# Patient Record
Sex: Female | Born: 1940 | Race: White | Hispanic: No | Marital: Married
Health system: Northeastern US, Academic
[De-identification: ages and names within clinical notes are randomized; demographics above are authoritative.]

---

## 2019-12-17 ENCOUNTER — Encounter: Admit: 2019-12-17 | Payer: PRIVATE HEALTH INSURANCE | Attending: Internal Medicine | Primary: Family Medicine

## 2019-12-17 ENCOUNTER — Inpatient Hospital Stay: Admit: 2019-12-17 | Discharge: 2019-12-17 | Payer: MEDICARE | Primary: Family Medicine

## 2019-12-17 DIAGNOSIS — R7303 Prediabetes: Secondary | ICD-10-CM

## 2019-12-17 DIAGNOSIS — I10 Essential (primary) hypertension: Secondary | ICD-10-CM

## 2019-12-17 DIAGNOSIS — N3000 Acute cystitis without hematuria: Secondary | ICD-10-CM

## 2019-12-17 DIAGNOSIS — Z01818 Encounter for other preprocedural examination: Secondary | ICD-10-CM

## 2019-12-17 DIAGNOSIS — Z1159 Encounter for screening for other viral diseases: Secondary | ICD-10-CM

## 2019-12-17 LAB — CBC WITH AUTO DIFFERENTIAL
BKR WAM ABSOLUTE IMMATURE GRANULOCYTES: 0 x 1000/ÂµL (ref 0.0–0.4)
BKR WAM ABSOLUTE LYMPHOCYTE COUNT: 0.9 x 1000/ÂµL (ref 0.5–5.4)
BKR WAM ABSOLUTE NRBC: 0 x 1000/ÂµL
BKR WAM ANALYZER ANC: 2.3 x 1000/ÂµL (ref 2.2–7.2)
BKR WAM BASOPHIL ABSOLUTE COUNT: 0 x 1000/ÂµL (ref 0.0–0.2)
BKR WAM BASOPHILS: 0.5 % (ref 0.0–2.0)
BKR WAM EOSINOPHIL ABSOLUTE COUNT: 0.1 x 1000/ÂµL (ref 0.0–0.4)
BKR WAM EOSINOPHILS: 2.7 % (ref 0.0–4.0)
BKR WAM HEMATOCRIT: 38.6 % (ref 36.0–48.0)
BKR WAM HEMOGLOBIN: 12.7 g/dL (ref 12.0–15.0)
BKR WAM IMMATURE GRANULOCYTES: 0.3 % (ref 0.0–0.4)
BKR WAM LYMPHOCYTES: 24.1 % (ref 10.0–50.0)
BKR WAM MCH (PG): 30.3 pg (ref 25.0–35.0)
BKR WAM MCHC: 32.9 g/dL — ABNORMAL LOW (ref 33.0–37.0)
BKR WAM MCV: 92.1 fL (ref 81.0–99.0)
BKR WAM MONOCYTE ABSOLUTE COUNT: 0.4 x 1000/ÂµL (ref 0.1–1.2)
BKR WAM MONOCYTES: 9.9 % (ref 3.0–11.0)
BKR WAM MPV: 11.4 fL (ref 8.0–12.0)
BKR WAM NEUTROPHILS: 62.5 % (ref 45.0–90.0)
BKR WAM NUCLEATED RED BLOOD CELLS: 0 % (ref 0.0–0.0)
BKR WAM PLATELETS: 166 x1000/ÂµL (ref 120–450)
BKR WAM RDW-CV: 13.1 % (ref 11.5–14.5)
BKR WAM RED BLOOD CELL COUNT: 4.2 M/ÂµL (ref 3.5–5.5)
BKR WAM WHITE BLOOD CELL COUNT: 3.7 x1000/ÂµL — ABNORMAL LOW (ref 4.8–10.8)

## 2019-12-17 LAB — COMPREHENSIVE METABOLIC PANEL
BKR A/G RATIO: 2 (ref 1.0–2.2)
BKR ALANINE AMINOTRANSFERASE (ALT): 14 U/L (ref 10–35)
BKR ALBUMIN: 4.3 g/dL (ref 3.6–4.9)
BKR ALKALINE PHOSPHATASE: 87 U/L (ref 9–122)
BKR ANION GAP: 9 (ref 7–17)
BKR ASPARTATE AMINOTRANSFERASE (AST): 20 U/L (ref 10–35)
BKR AST/ALT RATIO: 1.4
BKR BILIRUBIN TOTAL: 0.5 mg/dL (ref <=1.2)
BKR BLOOD UREA NITROGEN: 24 mg/dL — ABNORMAL HIGH (ref 8–23)
BKR BUN / CREAT RATIO: 40 — ABNORMAL HIGH (ref 8.0–23.0)
BKR CALCIUM: 9.2 mg/dL (ref 8.8–10.2)
BKR CHLORIDE: 103 mmol/L (ref 98–107)
BKR CO2: 29 mmol/L (ref 20–30)
BKR CREATININE: 0.6 mg/dL (ref 0.40–1.30)
BKR EGFR (AFR AMER): >60 mL/min/{1.73_m2} (ref >60)
BKR EGFR (NON AFRICAN AMERICAN): >60 mL/min/{1.73_m2} (ref >60)
BKR GLOBULIN: 2.1 g/dL
BKR GLUCOSE: 87 mg/dL (ref 70–100)
BKR POTASSIUM: 4.4 mmol/L (ref 3.3–5.1)
BKR PROTEIN TOTAL: 6.4 g/dL — ABNORMAL LOW (ref 6.6–8.7)
BKR SODIUM: 141 mmol/L (ref 136–144)

## 2019-12-17 LAB — HEMOGLOBIN A1C
BKR ESTIMATED AVERAGE GLUCOSE: 128 mg/dL
BKR HEMOGLOBIN A1C: 6.1 % — ABNORMAL HIGH (ref 4.0–5.6)

## 2019-12-17 LAB — PARTIAL THROMBOPLASTIN TIME     (BH GH LMW Q YH): BKR PARTIAL THROMBOPLASTIN TIME: 26.8 s (ref 23.0–32.1)

## 2019-12-17 LAB — PROTIME AND INR
BKR INR: 0.98 (ref 0.87–1.13)
BKR PROTHROMBIN TIME: 11 s (ref 9.5–12.1)

## 2019-12-17 MED ORDER — ALPRAZOLAM 0.25 MG TABLET
0.25 | ORAL_TABLET | Freq: Every evening | ORAL | 2 refills | 25.00000 days | Status: AC | PRN
Start: 2019-12-17 — End: ?

## 2019-12-17 MED ORDER — ALPRAZOLAM 0.25 MG TABLET
0.25 | Freq: Every evening | ORAL | 1.00 refills | 25.00000 days | Status: AC | PRN
Start: 2019-12-17 — End: 2019-12-17

## 2019-12-18 LAB — MSSA / MRSA SCREEN BY CULTURE   (BH GH LMW YH)
BKR MRSA MEDIA: NEGATIVE
BKR MSSA MEDIA (SAID): NEGATIVE

## 2019-12-18 LAB — HEPATITIS C AB WITH REFLEX TO HCV PCR
BKR HEPATITIS C ANTIBODY (BH): NONREACTIVE
BKR HEPATITIS C ANTIBODY INITIAL RESULT (BH): 0.04 COI

## 2019-12-19 NOTE — Other
A1c is in prediabetic range, MRSA screen is negative, otherwise Labs are normal w/o any significant acute abnormalities. Please let the patient know. Thank you

## 2019-12-19 NOTE — Other
Lmtc

## 2019-12-19 NOTE — Other
Pt.notified

## 2019-12-20 MED ORDER — CIPROFLOXACIN 250 MG TABLET
250 | ORAL_TABLET | Freq: Two times a day (BID) | ORAL | 1 refills | 7.00000 days | Status: AC
Start: 2019-12-20 — End: ?

## 2019-12-20 MED ORDER — NITROFURANTOIN MONOHYDRATE/MACROCRYSTALS 100 MG CAPSULE
100 | ORAL_CAPSULE | Freq: Two times a day (BID) | ORAL | 1 refills | 5.00000 days | Status: AC
Start: 2019-12-20 — End: 2019-12-20

## 2019-12-20 NOTE — Other
Her urine culture is growing some Gram-negative rods, not very convincing about a UTI but I am concerned that her surgery may be canceled due to this.  I would suggest that we consider treating with antibiotics, I will send Macrobid for 7 days to her pharmacy. Please let the patient know. Thank you

## 2019-12-20 NOTE — Other
Called pt, will change it to Cipro based on sens results. Thanks

## 2019-12-20 NOTE — Other
Pt.notified

## 2019-12-21 LAB — URINE CULTURE

## 2020-03-19 MED ORDER — HYDROXYZINE HCL 25 MG TABLET
25 | ORAL_TABLET | Freq: Three times a day (TID) | ORAL | 3 refills | 20.00000 days | Status: AC | PRN
Start: 2020-03-19 — End: 2021-04-22

## 2020-03-19 MED ORDER — TRAZODONE 50 MG TABLET
50 | ORAL_TABLET | Freq: Every evening | ORAL | 2 refills | 30.00000 days | Status: AC
Start: 2020-03-19 — End: 2021-04-22

## 2020-05-02 MED ORDER — ALBUTEROL SULFATE HFA 90 MCG/ACTUATION AEROSOL INHALER
90 | RESPIRATORY_TRACT | 2 refills | 25.00000 days | Status: AC | PRN
Start: 2020-05-02 — End: 2021-06-12

## 2020-05-09 MED ORDER — VALSARTAN 160 MG-HYDROCHLOROTHIAZIDE 25 MG TABLET
160-25 | ORAL | 3.00 refills | 90.00000 days | Status: AC
Start: 2020-05-09 — End: ?

## 2021-04-22 MED ORDER — GABAPENTIN 300 MG CAPSULE
300 | ORAL_CAPSULE | Freq: Four times a day (QID) | ORAL | 2 refills | 30.00000 days | Status: AC | PRN
Start: 2021-04-22 — End: 2022-05-14

## 2021-04-22 MED ORDER — HYDROXYZINE HCL 25 MG TABLET
25 | ORAL_TABLET | Freq: Three times a day (TID) | ORAL | 3 refills | 20.00000 days | Status: AC | PRN
Start: 2021-04-22 — End: 2021-12-11

## 2021-04-22 MED ORDER — GABAPENTIN 300 MG CAPSULE
300 | ORAL | 2.00 refills | 30.00000 days | Status: AC
Start: 2021-04-22 — End: 2021-04-22

## 2021-06-12 MED ORDER — ALBUTEROL SULFATE HFA 90 MCG/ACTUATION AEROSOL INHALER
90 | RESPIRATORY_TRACT | 2 refills | 25.00000 days | Status: AC
Start: 2021-06-12 — End: 2021-12-11

## 2021-12-08 ENCOUNTER — Encounter: Admit: 2021-12-08 | Payer: PRIVATE HEALTH INSURANCE | Attending: Internal Medicine | Primary: Family Medicine

## 2021-12-11 ENCOUNTER — Telehealth: Admit: 2021-12-11 | Payer: PRIVATE HEALTH INSURANCE | Attending: Family Medicine | Primary: Family Medicine

## 2021-12-11 ENCOUNTER — Ambulatory Visit: Admit: 2021-12-11 | Payer: MEDICARE | Attending: Family Medicine | Primary: Family Medicine

## 2021-12-11 ENCOUNTER — Encounter: Admit: 2021-12-11 | Payer: PRIVATE HEALTH INSURANCE | Attending: Family Medicine | Primary: Family Medicine

## 2021-12-11 MED ORDER — BREZTRI AEROSPHERE 160 MCG-9MCG-4.8MCG/ACTUATION HFA AEROSOL INHALER
160-9-4.8 | Freq: Two times a day (BID) | RESPIRATORY_TRACT | 1 refills | 30.00000 days | Status: AC
Start: 2021-12-11 — End: 2022-04-13

## 2021-12-11 MED ORDER — FLUTICASONE PROPIONATE 50 MCG/ACTUATION NASAL SPRAY,SUSPENSION
50 | Freq: Two times a day (BID) | NASAL | 3 refills | 30.00000 days | Status: AC
Start: 2021-12-11 — End: 2021-12-29

## 2021-12-11 MED ORDER — ALBUTEROL SULFATE HFA 90 MCG/ACTUATION AEROSOL INHALER
90 | Freq: Four times a day (QID) | RESPIRATORY_TRACT | 4 refills | 25.00000 days | Status: AC | PRN
Start: 2021-12-11 — End: 2022-12-15

## 2021-12-11 MED ORDER — AZITHROMYCIN 250 MG TABLET
250 | ORAL_TABLET | ORAL | 1 refills | 5.00000 days | Status: AC
Start: 2021-12-11 — End: 2021-12-11

## 2021-12-11 MED ORDER — MONTELUKAST 10 MG TABLET
10 | ORAL_TABLET | Freq: Every evening | ORAL | 4 refills | 90.00000 days | Status: AC
Start: 2021-12-11 — End: 2022-02-22

## 2021-12-14 ENCOUNTER — Encounter: Admit: 2021-12-14 | Payer: PRIVATE HEALTH INSURANCE | Attending: Family Medicine | Primary: Family Medicine

## 2021-12-14 MED ORDER — AZITHROMYCIN 250 MG TABLET
250 | ORAL_TABLET | ORAL | 1 refills | 5.00000 days | Status: AC
Start: 2021-12-14 — End: ?

## 2021-12-29 ENCOUNTER — Encounter: Admit: 2021-12-29 | Payer: PRIVATE HEALTH INSURANCE | Attending: Family Medicine | Primary: Family Medicine

## 2021-12-29 MED ORDER — FLUTICASONE PROPIONATE 50 MCG/ACTUATION NASAL SPRAY,SUSPENSION
50 | NASAL | 5 refills | 30.00000 days | Status: AC
Start: 2021-12-29 — End: 2022-04-13

## 2022-01-22 ENCOUNTER — Encounter
Admit: 2022-01-22 | Payer: PRIVATE HEALTH INSURANCE | Attending: Vascular and Interventional Radiology | Primary: Family Medicine

## 2022-02-20 ENCOUNTER — Encounter: Admit: 2022-02-20 | Payer: PRIVATE HEALTH INSURANCE | Attending: Family Medicine | Primary: Family Medicine

## 2022-02-22 MED ORDER — MONTELUKAST 10 MG TABLET
10 | ORAL_TABLET | Freq: Every evening | ORAL | 5 refills | 90.00000 days | Status: AC
Start: 2022-02-22 — End: 2022-05-14

## 2022-02-26 ENCOUNTER — Encounter: Admit: 2022-02-26 | Payer: PRIVATE HEALTH INSURANCE | Attending: Internal Medicine | Primary: Family Medicine

## 2022-03-17 ENCOUNTER — Encounter: Admit: 2022-03-17 | Payer: PRIVATE HEALTH INSURANCE | Primary: Family Medicine

## 2022-03-22 ENCOUNTER — Encounter: Admit: 2022-03-22 | Payer: MEDICARE | Attending: Gastroenterology | Primary: Family Medicine

## 2022-03-22 ENCOUNTER — Encounter: Admit: 2022-03-22 | Payer: PRIVATE HEALTH INSURANCE | Attending: Gastroenterology | Primary: Family Medicine

## 2022-03-22 MED ORDER — PEG 3350 240 GRAM-ELECTROLYTES 22.72 GRAM-6.72 G-5.84 G POWDR FOR SOLN
240-22.72-6.72 | Freq: Once | ORAL | 1 refills | 30.00000 days | Status: AC
Start: 2022-03-22 — End: ?

## 2022-03-28 ENCOUNTER — Encounter: Admit: 2022-03-28 | Payer: PRIVATE HEALTH INSURANCE | Attending: Gastroenterology | Primary: Family Medicine

## 2022-03-29 ENCOUNTER — Encounter: Admit: 2022-03-29 | Payer: PRIVATE HEALTH INSURANCE | Attending: Family Medicine | Primary: Family Medicine

## 2022-03-30 ENCOUNTER — Encounter: Admit: 2022-03-30 | Payer: PRIVATE HEALTH INSURANCE | Attending: Family Medicine | Primary: Family Medicine

## 2022-04-04 ENCOUNTER — Encounter: Admit: 2022-04-04 | Payer: PRIVATE HEALTH INSURANCE | Attending: Family Medicine | Primary: Family Medicine

## 2022-04-07 ENCOUNTER — Encounter: Admit: 2022-04-07 | Payer: PRIVATE HEALTH INSURANCE | Attending: Family Medicine | Primary: Family Medicine

## 2022-04-13 ENCOUNTER — Encounter: Admit: 2022-04-13 | Payer: PRIVATE HEALTH INSURANCE | Attending: Family Medicine | Primary: Family Medicine

## 2022-04-13 ENCOUNTER — Encounter: Admit: 2022-04-13 | Payer: PRIVATE HEALTH INSURANCE | Primary: Family Medicine

## 2022-04-13 ENCOUNTER — Ambulatory Visit: Admit: 2022-04-13 | Payer: MEDICARE | Attending: Family Medicine | Primary: Family Medicine

## 2022-04-13 ENCOUNTER — Telehealth: Admit: 2022-04-13 | Payer: PRIVATE HEALTH INSURANCE | Attending: Family Medicine | Primary: Family Medicine

## 2022-04-13 ENCOUNTER — Inpatient Hospital Stay: Admit: 2022-04-13 | Discharge: 2022-04-13 | Payer: MEDICARE | Primary: Family Medicine

## 2022-04-13 DIAGNOSIS — R8290 Unspecified abnormal findings in urine: Secondary | ICD-10-CM

## 2022-04-13 DIAGNOSIS — R002 Palpitations: Secondary | ICD-10-CM

## 2022-04-13 LAB — TSH W/REFLEX TO FT4     (BH GH LMW Q YH): BKR THYROID STIMULATING HORMONE: 1.65 ??IU/mL

## 2022-04-13 MED ORDER — ESCITALOPRAM 5 MG TABLET
5 | ORAL_TABLET | Freq: Every morning | ORAL | 3 refills | 90.00000 days | Status: AC
Start: 2022-04-13 — End: 2022-05-14

## 2022-04-14 ENCOUNTER — Encounter: Admit: 2022-04-14 | Payer: PRIVATE HEALTH INSURANCE | Attending: Family | Primary: Family Medicine

## 2022-04-14 LAB — URINE CULTURE: BKR URINE CULTURE, ROUTINE: NO GROWTH

## 2022-04-14 MED ORDER — TRAZODONE 50 MG TABLET
50 | ORAL_TABLET | Freq: Every evening | ORAL | 2 refills | 30.00000 days | Status: AC
Start: 2022-04-14 — End: 2022-05-20

## 2022-04-20 ENCOUNTER — Encounter: Admit: 2022-04-20 | Payer: PRIVATE HEALTH INSURANCE | Attending: Family Medicine | Primary: Family Medicine

## 2022-04-23 ENCOUNTER — Encounter: Admit: 2022-04-23 | Payer: MEDICARE | Attending: Internal Medicine | Primary: Family Medicine

## 2022-04-27 ENCOUNTER — Telehealth: Admit: 2022-04-27 | Payer: PRIVATE HEALTH INSURANCE | Attending: Obstetrics and Gynecology | Primary: Family Medicine

## 2022-05-04 ENCOUNTER — Encounter: Admit: 2022-05-04 | Payer: MEDICARE | Attending: Obstetrics and Gynecology | Primary: Family Medicine

## 2022-05-05 ENCOUNTER — Encounter
Admit: 2022-05-05 | Payer: PRIVATE HEALTH INSURANCE | Attending: Vascular and Interventional Radiology | Primary: Family Medicine

## 2022-05-10 ENCOUNTER — Encounter: Admit: 2022-05-10 | Payer: PRIVATE HEALTH INSURANCE | Attending: Gastroenterology | Primary: Family Medicine

## 2022-05-10 ENCOUNTER — Telehealth: Admit: 2022-05-10 | Payer: PRIVATE HEALTH INSURANCE | Attending: Obstetrics and Gynecology | Primary: Family Medicine

## 2022-05-10 ENCOUNTER — Encounter: Admit: 2022-05-10 | Payer: PRIVATE HEALTH INSURANCE | Attending: Obstetrics and Gynecology | Primary: Family Medicine

## 2022-05-10 ENCOUNTER — Ambulatory Visit: Admit: 2022-05-10 | Payer: MEDICARE | Attending: Obstetrics and Gynecology | Primary: Family Medicine

## 2022-05-10 MED ORDER — MAGNESIUM OXIDE 400 MG (241.3 MG MAGNESIUM) TABLET
400 | Freq: Every day | ORAL | 1.00 refills | 90.00000 days | Status: AC
Start: 2022-05-10 — End: ?

## 2022-05-11 ENCOUNTER — Encounter: Admit: 2022-05-11 | Payer: PRIVATE HEALTH INSURANCE | Primary: Family Medicine

## 2022-05-14 ENCOUNTER — Ambulatory Visit: Admit: 2022-05-14 | Payer: MEDICARE | Attending: Family Medicine | Primary: Family Medicine

## 2022-05-14 ENCOUNTER — Encounter: Admit: 2022-05-14 | Payer: PRIVATE HEALTH INSURANCE | Attending: Family Medicine | Primary: Family Medicine

## 2022-05-14 ENCOUNTER — Telehealth: Admit: 2022-05-14 | Payer: PRIVATE HEALTH INSURANCE | Attending: Family Medicine | Primary: Family Medicine

## 2022-05-14 ENCOUNTER — Encounter: Admit: 2022-05-14 | Payer: PRIVATE HEALTH INSURANCE | Primary: Family Medicine

## 2022-05-14 ENCOUNTER — Encounter
Admit: 2022-05-14 | Payer: PRIVATE HEALTH INSURANCE | Attending: Vascular and Interventional Radiology | Primary: Family Medicine

## 2022-05-14 MED ORDER — ONDANSETRON 4 MG DISINTEGRATING TABLET
4 | ORAL_TABLET | Freq: Three times a day (TID) | 1 refills | 7.00000 days | Status: AC | PRN
Start: 2022-05-14 — End: 2022-05-14

## 2022-05-14 MED ORDER — ALPRAZOLAM 0.25 MG TABLET
0.25 | ORAL_TABLET | Freq: Three times a day (TID) | ORAL | 1 refills | 25.00000 days | Status: AC | PRN
Start: 2022-05-14 — End: 2022-05-14

## 2022-05-14 MED ORDER — ONDANSETRON 4 MG DISINTEGRATING TABLET
4 | ORAL_TABLET | Freq: Three times a day (TID) | 1 refills | 7.00000 days | Status: AC | PRN
Start: 2022-05-14 — End: 2022-05-20

## 2022-05-14 MED ORDER — MULTIVITAMIN WITH MINERALS CAPSULE
Freq: Every day | ORAL | 0.00 refills | 90.00000 days | Status: AC
Start: 2022-05-14 — End: 2023-04-04

## 2022-05-14 MED ORDER — APIXABAN 5 MG TABLET
5 | Freq: Two times a day (BID) | ORAL | 4.00 refills | 30.00000 days | Status: AC
Start: 2022-05-14 — End: ?

## 2022-05-14 MED ORDER — ALPRAZOLAM 0.25 MG TABLET
0.25 | ORAL_TABLET | Freq: Three times a day (TID) | ORAL | 1 refills | 25.00000 days | Status: AC | PRN
Start: 2022-05-14 — End: ?

## 2022-05-14 MED ORDER — METOPROLOL SUCCINATE ER 50 MG TABLET,EXTENDED RELEASE 24 HR
50 | Freq: Every day | ORAL | 4.00 refills | 90.00000 days | Status: AC
Start: 2022-05-14 — End: 2023-03-31

## 2022-05-14 MED ORDER — ROSUVASTATIN 40 MG TABLET
40 | ORAL | 4.00 refills | 90.00000 days | Status: AC
Start: 2022-05-14 — End: ?

## 2022-05-14 MED ORDER — TRIPLE MAGNESIUM COMPLEX 400 MG MAGNESIUM CAPSULE
400 | Freq: Every day | ORAL | 0.00 refills | 90.00000 days | Status: AC
Start: 2022-05-14 — End: 2023-04-04

## 2022-05-20 ENCOUNTER — Encounter: Admit: 2022-05-20 | Payer: PRIVATE HEALTH INSURANCE | Attending: Family Medicine | Primary: Family Medicine

## 2022-05-20 ENCOUNTER — Encounter: Admit: 2022-05-20 | Payer: MEDICARE | Attending: Family Medicine | Primary: Family Medicine

## 2022-05-20 MED ORDER — TRAZODONE 50 MG TABLET
50 | ORAL_TABLET | Freq: Every evening | ORAL | 4 refills | 30.00000 days | Status: AC
Start: 2022-05-20 — End: 2022-08-03

## 2022-05-20 MED ORDER — ONDANSETRON 4 MG DISINTEGRATING TABLET
4 | ORAL_TABLET | Freq: Three times a day (TID) | 1 refills | 7.00000 days | Status: AC | PRN
Start: 2022-05-20 — End: 2022-05-20

## 2022-05-20 MED ORDER — ONDANSETRON 4 MG DISINTEGRATING TABLET
4 | ORAL_TABLET | Freq: Three times a day (TID) | 1 refills | 7.00000 days | Status: AC | PRN
Start: 2022-05-20 — End: ?

## 2022-05-20 MED ORDER — DILTIAZEM CD 120 MG CAPSULE,EXTENDED RELEASE 24 HR
120 | Freq: Every day | ORAL | 4.00 refills | 90.00000 days | Status: AC
Start: 2022-05-20 — End: 2023-04-04

## 2022-07-06 ENCOUNTER — Encounter: Admit: 2022-07-06 | Payer: PRIVATE HEALTH INSURANCE | Attending: Obstetrics and Gynecology | Primary: Family Medicine

## 2022-08-03 ENCOUNTER — Encounter: Admit: 2022-08-03 | Payer: PRIVATE HEALTH INSURANCE | Attending: Family | Primary: Family Medicine

## 2022-08-03 MED ORDER — TRAZODONE 50 MG TABLET
50 | ORAL_TABLET | Freq: Every evening | ORAL | 4 refills | 30.00000 days | Status: AC
Start: 2022-08-03 — End: 2023-03-31

## 2022-11-11 ENCOUNTER — Telehealth: Admit: 2022-11-11 | Payer: PRIVATE HEALTH INSURANCE | Attending: Family Medicine | Primary: Family Medicine

## 2022-12-15 ENCOUNTER — Encounter: Admit: 2022-12-15 | Payer: PRIVATE HEALTH INSURANCE | Attending: Family Medicine | Primary: Family Medicine

## 2022-12-15 MED ORDER — ALBUTEROL SULFATE HFA 90 MCG/ACTUATION AEROSOL INHALER
90 | RESPIRATORY_TRACT | 4 refills | 25.00000 days | Status: AC
Start: 2022-12-15 — End: ?

## 2023-01-21 ENCOUNTER — Encounter: Admit: 2023-01-21 | Payer: PRIVATE HEALTH INSURANCE | Attending: Family Medicine | Primary: Family Medicine

## 2023-01-24 ENCOUNTER — Encounter: Admit: 2023-01-24 | Payer: PRIVATE HEALTH INSURANCE | Attending: Family Medicine | Primary: Family Medicine

## 2023-02-03 ENCOUNTER — Encounter: Admit: 2023-02-03 | Payer: MEDICARE | Primary: Family Medicine

## 2023-02-10 ENCOUNTER — Encounter: Admit: 2023-02-10 | Payer: PRIVATE HEALTH INSURANCE | Primary: Family Medicine

## 2023-02-10 ENCOUNTER — Encounter: Admit: 2023-02-10 | Payer: MEDICARE | Primary: Family Medicine

## 2023-02-14 ENCOUNTER — Encounter: Admit: 2023-02-14 | Payer: MEDICARE | Primary: Family Medicine

## 2023-02-18 ENCOUNTER — Telehealth: Admit: 2023-02-18 | Payer: PRIVATE HEALTH INSURANCE | Attending: Family Medicine | Primary: Family Medicine

## 2023-02-21 ENCOUNTER — Encounter: Admit: 2023-02-21 | Payer: PRIVATE HEALTH INSURANCE | Primary: Family Medicine

## 2023-02-21 ENCOUNTER — Encounter: Admit: 2023-02-21 | Payer: MEDICARE | Primary: Family Medicine

## 2023-02-23 ENCOUNTER — Encounter: Admit: 2023-02-23 | Payer: PRIVATE HEALTH INSURANCE | Attending: Family Medicine | Primary: Family Medicine

## 2023-02-28 ENCOUNTER — Encounter: Admit: 2023-02-28 | Payer: MEDICARE | Primary: Family Medicine

## 2023-03-11 ENCOUNTER — Encounter: Admit: 2023-03-11 | Payer: PRIVATE HEALTH INSURANCE | Attending: Family Medicine | Primary: Family Medicine

## 2023-03-11 ENCOUNTER — Encounter: Admit: 2023-03-11 | Payer: PRIVATE HEALTH INSURANCE | Primary: Family Medicine

## 2023-03-11 ENCOUNTER — Encounter: Admit: 2023-03-11 | Payer: MEDICARE | Primary: Family Medicine

## 2023-03-15 ENCOUNTER — Encounter: Admit: 2023-03-15 | Payer: PRIVATE HEALTH INSURANCE | Attending: Family Medicine | Primary: Family Medicine

## 2023-03-31 ENCOUNTER — Encounter: Admit: 2023-03-31 | Payer: PRIVATE HEALTH INSURANCE | Attending: Family Medicine | Primary: Family Medicine

## 2023-03-31 ENCOUNTER — Inpatient Hospital Stay: Admit: 2023-03-31 | Discharge: 2023-03-31 | Payer: MEDICARE | Primary: Family Medicine

## 2023-03-31 ENCOUNTER — Ambulatory Visit: Admit: 2023-03-31 | Payer: MEDICARE | Attending: Family Medicine | Primary: Family Medicine

## 2023-03-31 DIAGNOSIS — G5601 Carpal tunnel syndrome, right upper limb: Secondary | ICD-10-CM

## 2023-03-31 DIAGNOSIS — Z01818 Encounter for other preprocedural examination: Secondary | ICD-10-CM

## 2023-03-31 LAB — BASIC METABOLIC PANEL
BKR ANION GAP: 9 g/dL (ref 7–17)
BKR BLOOD UREA NITROGEN: 21 mg/dL (ref 8–23)
BKR BUN / CREAT RATIO: 29.6 % — ABNORMAL HIGH (ref 8.0–23.0)
BKR CALCIUM: 9.6 mg/dL — ABNORMAL HIGH (ref 8.8–10.2)
BKR CHLORIDE: 98 mmol/L (ref 98–107)
BKR CO2: 30 mmol/L (ref 20–30)
BKR CREATININE: 0.71 mg/dL (ref 0.40–1.30)
BKR EGFR, CREATININE (CKD-EPI 2021): >60 mL/min/{1.73_m2} (ref >=60–12.0)
BKR GLUCOSE: 103 mg/dL — ABNORMAL HIGH (ref 70–100)
BKR POTASSIUM: 4.5 mmol/L (ref 3.3–5.3)
BKR SODIUM: 137 mmol/L (ref 136–144)

## 2023-03-31 LAB — CBC WITH AUTO DIFFERENTIAL
BKR WAM ABSOLUTE IMMATURE GRANULOCYTES.: 0.01 x 1000/ÂµL (ref 0.00–0.30)
BKR WAM ABSOLUTE LYMPHOCYTE COUNT.: 0.98 x 1000/ÂµL (ref 0.60–3.70)
BKR WAM ABSOLUTE NRBC (2 DEC): 0 x 1000/ÂµL (ref 0.00–1.00)
BKR WAM ANC (ABSOLUTE NEUTROPHIL COUNT): 4.41 x 1000/ÂµL (ref 2.00–7.60)
BKR WAM BASOPHIL ABSOLUTE COUNT.: 0.02 x 1000/ÂµL (ref 0.00–1.00)
BKR WAM BASOPHILS: 0.3 % (ref 0.0–1.4)
BKR WAM EOSINOPHIL ABSOLUTE COUNT.: 0.06 x 1000/ÂµL (ref 0.00–1.00)
BKR WAM EOSINOPHILS: 1 % (ref 0.0–5.0)
BKR WAM HEMATOCRIT (2 DEC): 36.9 % (ref 35.00–45.00)
BKR WAM HEMOGLOBIN: 11.8 g/dL (ref 11.7–15.5)
BKR WAM IMMATURE GRANULOCYTES: 0.2 % (ref 0.0–1.0)
BKR WAM LYMPHOCYTES: 16.3 % — ABNORMAL LOW (ref 17.0–50.0)
BKR WAM MCH (PG): 29.1 pg (ref 27.0–33.0)
BKR WAM MCHC: 32 g/dL (ref 31.0–36.0)
BKR WAM MCV: 90.9 fL (ref 80.0–100.0)
BKR WAM MONOCYTE ABSOLUTE COUNT.: 0.53 x 1000/ÂµL (ref 0.00–1.00)
BKR WAM MONOCYTES: 8.8 % (ref 4.0–12.0)
BKR WAM MPV: 11 fL (ref 8.0–12.0)
BKR WAM NEUTROPHILS: 73.4 % — ABNORMAL HIGH (ref 39.0–72.0)
BKR WAM NUCLEATED RED BLOOD CELLS: 0 % (ref 0.0–1.0)
BKR WAM PLATELETS: 213 x1000/ÂµL (ref 150–420)
BKR WAM RDW-CV: 13.9 % (ref 11.0–15.0)
BKR WAM RED BLOOD CELL COUNT.: 4.06 M/ÂµL (ref 4.00–6.00)
BKR WAM WHITE BLOOD CELL COUNT: 6 x1000/ÂµL (ref 4.0–11.0)

## 2023-03-31 MED ORDER — ESCITALOPRAM 10 MG TABLET
10 | Freq: Every day | ORAL | 2.00 refills | 90.00000 days | Status: AC
Start: 2023-03-31 — End: 2023-05-17

## 2023-04-04 ENCOUNTER — Encounter: Admit: 2023-04-04 | Payer: PRIVATE HEALTH INSURANCE | Attending: Family Medicine | Primary: Family Medicine

## 2023-04-04 NOTE — Other
Faxed

## 2023-04-19 ENCOUNTER — Encounter: Admit: 2023-04-19 | Payer: MEDICARE | Attending: Family Medicine | Primary: Family Medicine

## 2023-04-21 ENCOUNTER — Encounter: Admit: 2023-04-21 | Payer: PRIVATE HEALTH INSURANCE | Attending: Pulmonary Disease | Primary: Family Medicine

## 2023-04-21 ENCOUNTER — Encounter: Admit: 2023-04-21 | Payer: MEDICARE | Attending: Pulmonary Disease | Primary: Family Medicine

## 2023-04-21 MED ORDER — STIOLTO RESPIMAT 2.5 MCG-2.5 MCG/ACTUATION SOLUTION FOR INHALATION
2.5-2.5 | RESPIRATORY_TRACT | 4.00 refills | 90.00000 days | Status: AC
Start: 2023-04-21 — End: ?

## 2023-04-21 MED ORDER — ACETAMINOPHEN 500 MG TABLET
500 | Freq: Three times a day (TID) | ORAL | 0.00 refills | 5.00000 days | Status: AC | PRN
Start: 2023-04-21 — End: 2023-05-09

## 2023-05-02 ENCOUNTER — Encounter: Admit: 2023-05-02 | Payer: MEDICARE | Attending: Pulmonary Disease | Primary: Family Medicine

## 2023-05-04 ENCOUNTER — Encounter: Admit: 2023-05-04 | Payer: PRIVATE HEALTH INSURANCE | Attending: Pulmonary Disease | Primary: Family Medicine

## 2023-05-05 ENCOUNTER — Encounter: Admit: 2023-05-05 | Payer: PRIVATE HEALTH INSURANCE | Attending: Pulmonary Disease | Primary: Family Medicine

## 2023-05-09 ENCOUNTER — Encounter: Admit: 2023-05-09 | Payer: MEDICARE | Attending: Pulmonary Disease | Primary: Family Medicine

## 2023-05-09 ENCOUNTER — Encounter: Admit: 2023-05-09 | Payer: PRIVATE HEALTH INSURANCE | Attending: Pulmonary Disease | Primary: Family Medicine

## 2023-05-16 ENCOUNTER — Telehealth: Admit: 2023-05-16 | Payer: PRIVATE HEALTH INSURANCE | Attending: Pulmonary Disease | Primary: Family Medicine

## 2023-05-17 ENCOUNTER — Ambulatory Visit: Admit: 2023-05-17 | Payer: MEDICARE | Attending: Family Medicine | Primary: Family Medicine

## 2023-05-17 ENCOUNTER — Encounter: Admit: 2023-05-17 | Payer: PRIVATE HEALTH INSURANCE | Attending: Family Medicine | Primary: Family Medicine

## 2023-05-17 ENCOUNTER — Inpatient Hospital Stay: Admit: 2023-05-17 | Discharge: 2023-05-17 | Payer: MEDICARE | Primary: Family Medicine

## 2023-05-17 DIAGNOSIS — R7303 Prediabetes: Secondary | ICD-10-CM

## 2023-05-17 DIAGNOSIS — I4891 Unspecified atrial fibrillation: Secondary | ICD-10-CM

## 2023-05-17 LAB — HEMOGLOBIN A1C
BKR ESTIMATED AVERAGE GLUCOSE: 128 mg/dL
BKR HEMOGLOBIN A1C: 6.1 % — ABNORMAL HIGH (ref 4.0–5.6)

## 2023-05-17 MED ORDER — ESCITALOPRAM 5 MG TABLET
5 | ORAL_TABLET | Freq: Every day | ORAL | 2 refills | 90.00000 days | Status: AC
Start: 2023-05-17 — End: 2023-07-05

## 2023-05-18 LAB — LIPID PANEL
BKR CHOLESTEROL/HDL RATIO: 2.3 (ref 0.0–5.0)
BKR CHOLESTEROL: 160 mg/dL
BKR HDL CHOLESTEROL: 69 mg/dL (ref >=40)
BKR LDL CHOLESTEROL SAMPSON CALCULATED: 75 mg/dL
BKR TRIGLYCERIDES: 85 mg/dL

## 2023-05-18 LAB — TSH W/REFLEX TO FT4     (BH GH LMW Q YH): BKR THYROID STIMULATING HORMONE: 2.12 u[IU]/mL

## 2023-05-19 ENCOUNTER — Encounter: Admit: 2023-05-19 | Payer: PRIVATE HEALTH INSURANCE | Attending: Family Medicine | Primary: Family Medicine

## 2023-06-01 ENCOUNTER — Telehealth: Admit: 2023-06-01 | Payer: PRIVATE HEALTH INSURANCE | Primary: Family Medicine

## 2023-06-07 ENCOUNTER — Encounter: Admit: 2023-06-07 | Payer: PRIVATE HEALTH INSURANCE | Attending: Family Medicine | Primary: Family Medicine

## 2023-06-21 IMAGING — MR MRI LUMBAR SPINE WITHOUT CONTRAST
5 of 8 series · 8 of 48 positions shown · IV contrast (gadolinium)
Comparison: MRI lumbar spine October 26, 2011.

________________________________________________________________________________________________ 
MRI LUMBAR SPINE WITHOUT CONTRAST, 06/21/2023 [DATE]: 
CLINICAL INDICATION: Radiculopathy, Lumbar Region , low back pain with radiation 
down the left leg. No trauma.
TECHNIQUE: Multiplanar, multiecho position MR images of the lumbar spine were 
performed without intravenous gadolinium enhancement. Patient was scanned on a 
3T magnet

[Series 101: survey · axial · 10.0mm · 1.39mm/px · 1 of 9 slices shown]
[im 1/9]
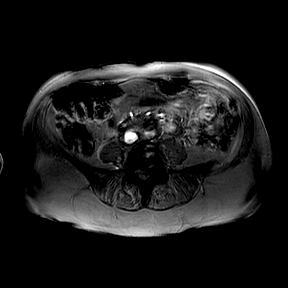

[Series 201: t2w_cor-surv · coronal · 6.0mm · 0.50mm/px · 1 of 7 slices shown]
[im 1/7]
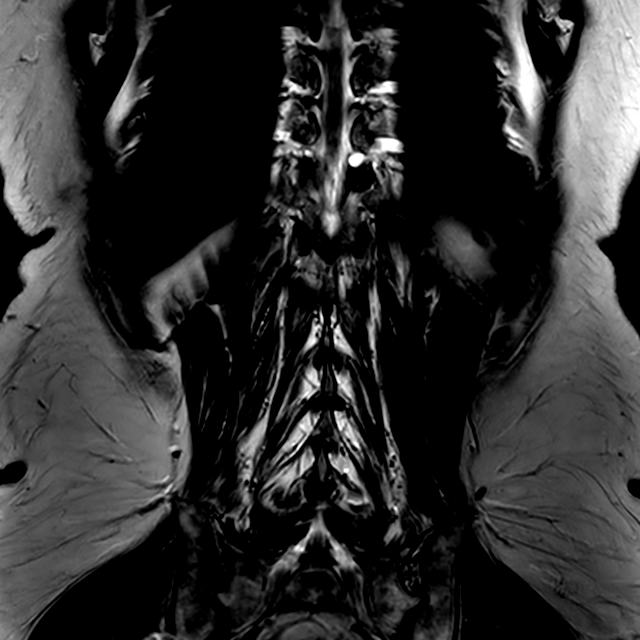

[Series 301: t1w_tse sag · sagittal · 4.0mm · 0.21mm/px · 2 of 17 slices shown]
[im 1/17]
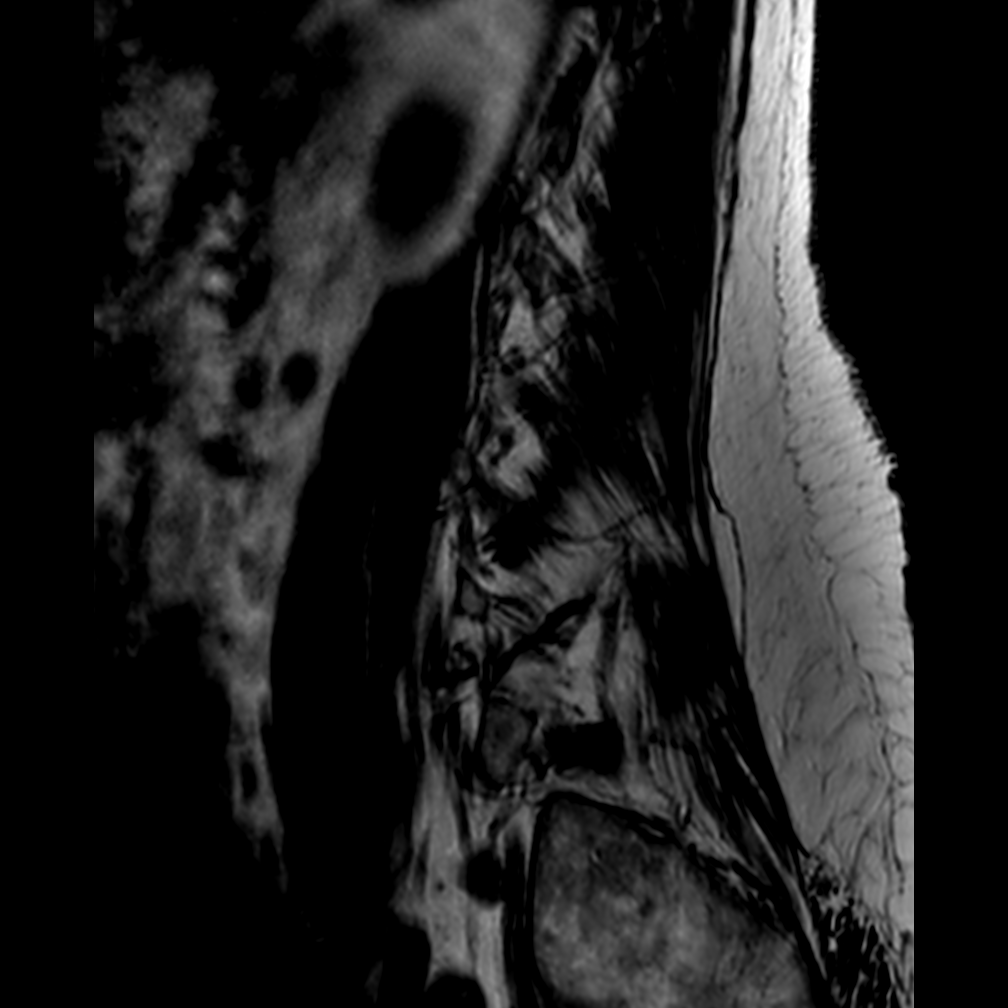
[im 17/17]
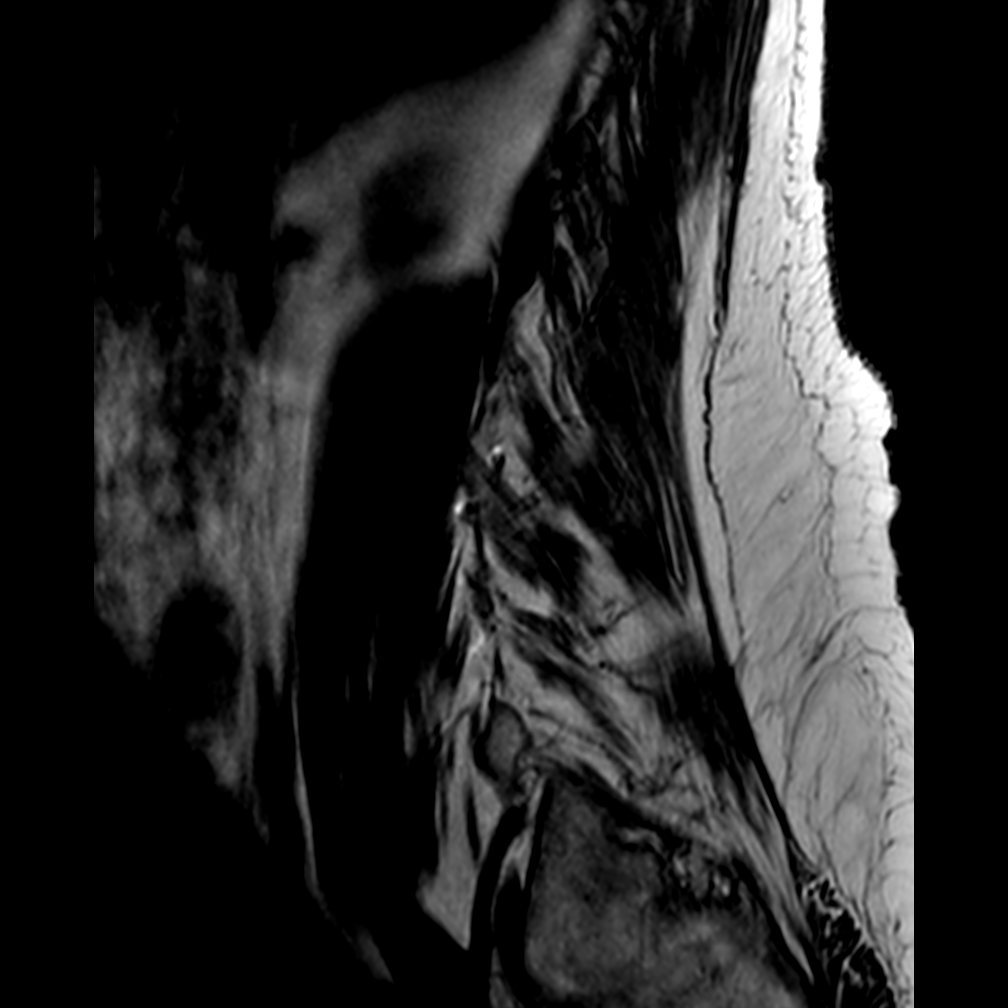

[Series 401: t2w_tse sag · sagittal · 4.0mm · 0.24mm/px · 3 of 17 slices shown]
[im 1/17]
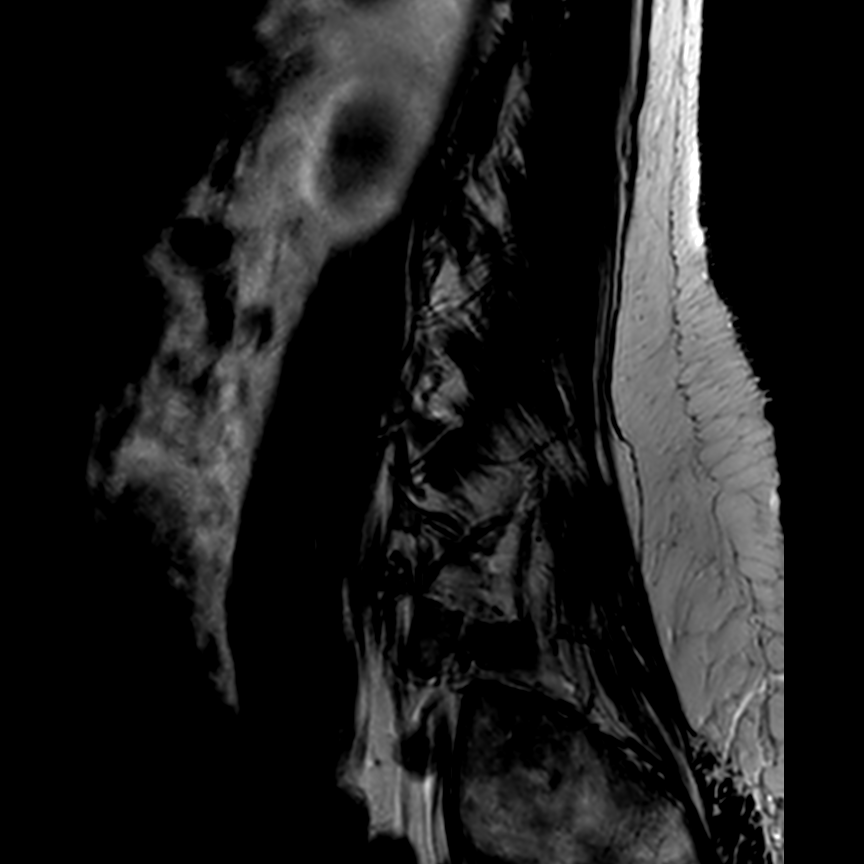
[im 9/17]
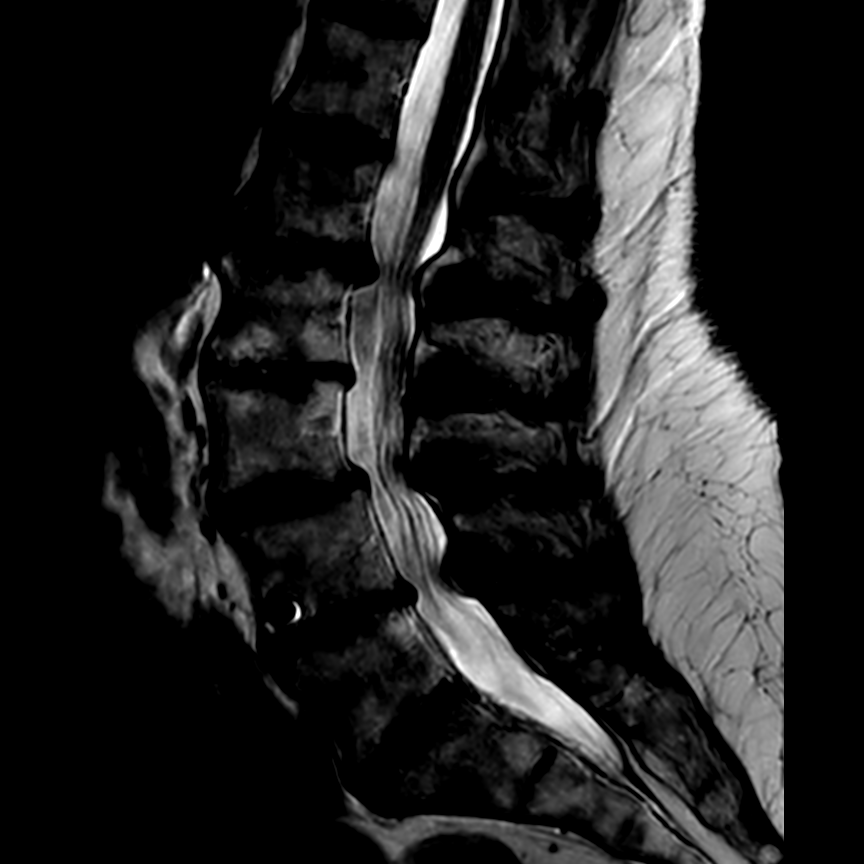
[im 17/17]
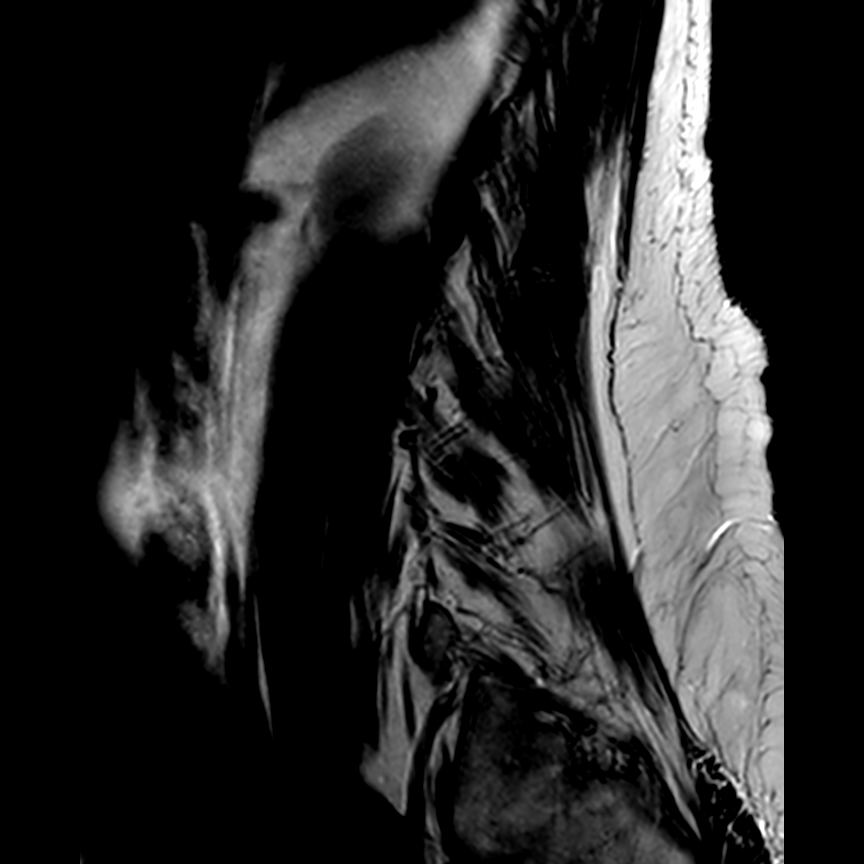

[Series 501: stir_sag-philips · sagittal · 4.0mm · 0.48mm/px · 1 of 17 slices shown]
[im 1/17]
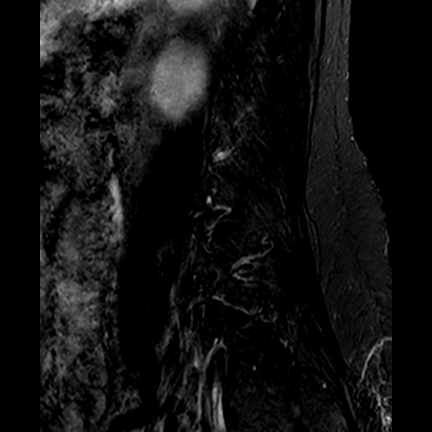

[8 of 48 positions shown; findings below may reference images not displayed]

FINDINGS: -------------------------------------------------------------------------------- 
------ 
GENERAL: 
Nomenclature is based on 5 lumbar type vertebral bodies.     
ALIGNMENT: Normal coronal alignment. Grade 1 retrolisthesis L1 on L2 with grade 
1 anterolisthesis L3 on L4. 
VERTEBRAL BODY HEIGHT: Normal.  
MARROW SIGNAL: No evidence of a marrow infiltrative process. There is a 
uniformly hypointense signal in the anterior aspect of the L4 vertebral body 
that could be related to metal artifact. 
CORD SIGNAL: Normal distal spinal cord and cauda equina. Conus medullaris 
terminates at L1-L2. 
ADDITIONAL FINDINGS: Susceptibility artifact from bilateral hip arthroplasties.. 
Modic I-II: L1-L2, L2-L3, L3-L4, L4-L5 
Ligamentum Flavum > 2.5 mm: All levels. 
-------------------------------------------------------------------------------- 
------ 
SEGMENTAL: 
T12-L1: Loss of disc signal. Annular bulge with a right paracentral disc 
extrusion measuring 4 mm in AP dimension. Borderline canal stenosis. Foramina 
patent. Normal facets. The extrusion is new in the interval. 
L1-L2: Loss of disc height and signal with degenerative endplate irregularity. 
Posterior osteophytic ridging and annular bulge with mild dorsal epidural 
lipomatosis. Mild canal stenosis with mild lateral recess narrowing bilaterally. 
Foramina patent. Facet arthropathy. Findings progressed. 
L2-L3: Loss of disc height and signal with degenerative endplate irregularity. 
Minimal central disc protrusion. Canal and foramina are patent. Mild facet 
arthropathy. Loss of disc height has progressed. Canal and foramina are stable. 
L3-L4: Loss of disc height and signal specifically posteriorly. Schmorls node. 
Mild to moderate canal stenosis with mild lateral recess narrowing bilaterally. 
Facet arthropathy and ligamentum flavum hypertrophy. Mild bilateral foraminal 
narrowing. Stable. 
L4-L5: Loss of disc height has progressed. Significant loss of disc height and 
signal with endplate irregularity and Schmorls nodes. Mild diffuse annular 
bulge with posterior osteophytic ridging with mild to moderate canal stenosis. 
Facet arthropathy, small right facet joint effusion and ligamentum flavum 
hypertrophy. Foramina are mild to moderately narrowed on the left and mildly 
narrowed on the right. Canal stenosis has progressed at this level. 
L5-S1: Loss of disc height and signal with degenerative endplate irregularity. 
Canal patent. Mild bilateral foraminal narrowing with facet arthropathy. Stable. 
-------------------------------------------------------------------------------- 
------
IMPRESSION: Progressive lumbar spondylosis at L1-L2 where there is mild canal stenosis and 
mild lateral recess narrowing bilaterally. 
Loss of disc height has progressed at L4-L5. Canal stenosis has progressed at 
this level. There is mild to moderate canal stenosis with mild to moderate left 
foraminal narrowing. 
Progressive loss of disc height also at L2-L3. 
Other stable appearing multilevel lumbar degenerative changes.

## 2023-07-04 ENCOUNTER — Encounter: Admit: 2023-07-04 | Payer: PRIVATE HEALTH INSURANCE | Attending: Family Medicine | Primary: Family Medicine

## 2023-07-05 ENCOUNTER — Encounter
Admit: 2023-07-05 | Payer: PRIVATE HEALTH INSURANCE | Attending: Vascular and Interventional Radiology | Primary: Family Medicine

## 2023-07-05 MED ORDER — ESCITALOPRAM 5 MG TABLET
5 | ORAL_TABLET | Freq: Every day | ORAL | 5 refills | 90.00000 days | Status: AC
Start: 2023-07-05 — End: ?

## 2023-07-11 ENCOUNTER — Encounter: Admit: 2023-07-11 | Payer: PRIVATE HEALTH INSURANCE | Primary: Family Medicine

## 2023-07-12 ENCOUNTER — Encounter: Admit: 2023-07-12 | Payer: PRIVATE HEALTH INSURANCE | Attending: Family Medicine | Primary: Family Medicine

## 2023-07-12 MED ORDER — RAMELTEON 8 MG TABLET
8 | ORAL_TABLET | Freq: Every evening | ORAL | 1 refills | 30.00000 days | Status: AC
Start: 2023-07-12 — End: ?

## 2023-09-27 ENCOUNTER — Encounter: Admit: 2023-09-27 | Payer: PRIVATE HEALTH INSURANCE | Primary: Family Medicine

## 2023-10-25 ENCOUNTER — Encounter: Admit: 2023-10-25 | Payer: PRIVATE HEALTH INSURANCE | Attending: Family Medicine | Primary: Family Medicine

## 2023-12-12 ENCOUNTER — Encounter
Admit: 2023-12-12 | Payer: PRIVATE HEALTH INSURANCE | Attending: Vascular and Interventional Radiology | Primary: Family Medicine

## 2023-12-13 ENCOUNTER — Encounter: Admit: 2023-12-13 | Payer: PRIVATE HEALTH INSURANCE | Attending: Pulmonary Disease | Primary: Family Medicine

## 2024-02-07 ENCOUNTER — Encounter
Admit: 2024-02-07 | Payer: PRIVATE HEALTH INSURANCE | Attending: Vascular and Interventional Radiology | Primary: Family Medicine

## 2024-04-12 ENCOUNTER — Encounter: Admit: 2024-04-12 | Payer: PRIVATE HEALTH INSURANCE | Attending: Family Medicine | Primary: Family Medicine

## 2024-04-18 ENCOUNTER — Telehealth: Admit: 2024-04-18 | Payer: PRIVATE HEALTH INSURANCE | Attending: Obstetrics and Gynecology | Primary: Family Medicine

## 2024-04-23 ENCOUNTER — Ambulatory Visit: Admit: 2024-04-23 | Payer: PRIVATE HEALTH INSURANCE | Attending: Obstetrics and Gynecology | Primary: Family Medicine

## 2024-04-23 ENCOUNTER — Encounter: Admit: 2024-04-23 | Payer: PRIVATE HEALTH INSURANCE | Attending: Obstetrics and Gynecology | Primary: Family Medicine

## 2024-04-23 DIAGNOSIS — D126 Benign neoplasm of colon, unspecified: Secondary | ICD-10-CM

## 2024-04-23 DIAGNOSIS — M1990 Unspecified osteoarthritis, unspecified site: Secondary | ICD-10-CM

## 2024-04-23 DIAGNOSIS — J45909 Unspecified asthma, uncomplicated: Secondary | ICD-10-CM

## 2024-04-23 DIAGNOSIS — G4733 Obstructive sleep apnea (adult) (pediatric): Secondary | ICD-10-CM

## 2024-04-23 DIAGNOSIS — N362 Urethral caruncle: Principal | ICD-10-CM

## 2024-04-23 DIAGNOSIS — K5730 Diverticulosis of large intestine without perforation or abscess without bleeding: Secondary | ICD-10-CM

## 2024-04-23 DIAGNOSIS — I4891 Unspecified atrial fibrillation: Secondary | ICD-10-CM

## 2024-04-23 DIAGNOSIS — I10 Essential (primary) hypertension: Secondary | ICD-10-CM

## 2024-04-23 DIAGNOSIS — J449 Chronic obstructive pulmonary disease, unspecified: Secondary | ICD-10-CM

## 2024-04-23 MED ORDER — ESTRADIOL 0.01% (0.1 MG/GRAM) VAGINAL CREAM
0.01 | Freq: Every day | VAGINAL | 13 refills | 42.00000 days | Status: AC
Start: 2024-04-23 — End: 2024-05-07

## 2024-04-24 ENCOUNTER — Encounter: Admit: 2024-04-24 | Payer: PRIVATE HEALTH INSURANCE | Attending: Pulmonary Disease | Primary: Family Medicine

## 2024-04-24 VITALS — BP 130/70 | HR 62 | Resp 16 | Ht 65.0 in | Wt 146.0 lb

## 2024-04-24 DIAGNOSIS — G4733 Obstructive sleep apnea (adult) (pediatric): Secondary | ICD-10-CM

## 2024-04-24 DIAGNOSIS — I4891 Unspecified atrial fibrillation: Secondary | ICD-10-CM

## 2024-04-24 DIAGNOSIS — G4719 Other hypersomnia: Secondary | ICD-10-CM

## 2024-04-24 DIAGNOSIS — J432 Centrilobular emphysema: Secondary | ICD-10-CM

## 2024-04-24 DIAGNOSIS — D126 Benign neoplasm of colon, unspecified: Secondary | ICD-10-CM

## 2024-04-24 DIAGNOSIS — J45909 Unspecified asthma, uncomplicated: Principal | ICD-10-CM

## 2024-04-24 DIAGNOSIS — J449 Chronic obstructive pulmonary disease, unspecified: Secondary | ICD-10-CM

## 2024-04-24 DIAGNOSIS — R0683 Snoring: Secondary | ICD-10-CM

## 2024-04-24 DIAGNOSIS — M1990 Unspecified osteoarthritis, unspecified site: Secondary | ICD-10-CM

## 2024-04-24 DIAGNOSIS — K5730 Diverticulosis of large intestine without perforation or abscess without bleeding: Secondary | ICD-10-CM

## 2024-04-24 DIAGNOSIS — I10 Essential (primary) hypertension: Secondary | ICD-10-CM

## 2024-04-25 ENCOUNTER — Encounter: Admit: 2024-04-25 | Payer: PRIVATE HEALTH INSURANCE | Attending: Obstetrics and Gynecology | Primary: Family Medicine

## 2024-05-03 ENCOUNTER — Encounter: Admit: 2024-05-03 | Payer: PRIVATE HEALTH INSURANCE | Primary: Family Medicine

## 2024-05-03 ENCOUNTER — Encounter: Admit: 2024-05-03 | Payer: PRIVATE HEALTH INSURANCE | Attending: Family Medicine | Primary: Family Medicine

## 2024-05-03 NOTE — Telephone Encounter
 Nurse Triage NoteShirley Cross reports  presents with severe shoulder pain and hand swelling.She experiences excruciating pain in both shoulders, which began on Tuesday, May 01, 2024. The pain is rated as 10 out of 10 and persists even without arm movement, worsening with activity. She has difficulty lifting her arms and performing daily activities such as dressing due to the pain.She notes swelling in her hands but not in her shoulders, with no associated redness. 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 She has never experienced gout in her shoulders before, only in her legs.No chest pain, back pain, or difficulty breathing. Her past medical history includes atrial fibrillation for which she underwent an ablation two years ago.Plans to go to [facility] today for evaluation.DispositionPatient triaged using Schmitt-Thompson, per protocol patient advised to be seen today, referred to a walk-in/urgent careCare advice provided to the patient. Advised the patient to call back with any new or worsening symptoms. Patient verbalizes understanding.Escalation required: NoReason for Disposition SEVERE pain (e.g., excruciating, unable to do any normal activities)Protocols used: Shoulder Pain-Adult-[place]

## 2024-05-04 ENCOUNTER — Ambulatory Visit: Admit: 2024-05-04 | Payer: MEDICARE | Attending: Family Medicine | Primary: Family Medicine

## 2024-05-04 ENCOUNTER — Inpatient Hospital Stay: Admit: 2024-05-04 | Discharge: 2024-05-04 | Payer: MEDICARE | Primary: Family Medicine

## 2024-05-04 ENCOUNTER — Encounter: Admit: 2024-05-04 | Payer: PRIVATE HEALTH INSURANCE | Attending: Family Medicine | Primary: Family Medicine

## 2024-05-04 ENCOUNTER — Encounter: Admit: 2024-05-04 | Payer: PRIVATE HEALTH INSURANCE | Primary: Family Medicine

## 2024-05-04 VITALS — BP 120/62 | HR 72 | Temp 98.00000°F | Resp 16 | Ht 65.0 in | Wt 145.9 lb

## 2024-05-04 DIAGNOSIS — M1990 Unspecified osteoarthritis, unspecified site: Secondary | ICD-10-CM

## 2024-05-04 DIAGNOSIS — J45909 Unspecified asthma, uncomplicated: Principal | ICD-10-CM

## 2024-05-04 DIAGNOSIS — G4733 Obstructive sleep apnea (adult) (pediatric): Secondary | ICD-10-CM

## 2024-05-04 DIAGNOSIS — I4891 Unspecified atrial fibrillation: Secondary | ICD-10-CM

## 2024-05-04 DIAGNOSIS — D126 Benign neoplasm of colon, unspecified: Secondary | ICD-10-CM

## 2024-05-04 DIAGNOSIS — I10 Essential (primary) hypertension: Secondary | ICD-10-CM

## 2024-05-04 DIAGNOSIS — K5730 Diverticulosis of large intestine without perforation or abscess without bleeding: Secondary | ICD-10-CM

## 2024-05-04 DIAGNOSIS — J449 Chronic obstructive pulmonary disease, unspecified: Secondary | ICD-10-CM

## 2024-05-04 LAB — RHEUMATOID FACTOR: BKR RHEUMATOID FACTOR: 11.9 [IU]/mL (ref <14.0)

## 2024-05-04 LAB — URIC ACID: BKR URIC ACID: 3.4 mg/dL (ref 2.7–7.3)

## 2024-05-04 LAB — C-REACTIVE PROTEIN     (CRP): BKR C-REACTIVE PROTEIN, HIGH SENSITIVITY: 121.8 mg/L — ABNORMAL HIGH

## 2024-05-04 LAB — SEDIMENTATION RATE (ESR): BKR SEDIMENTATION RATE, ERYTHROCYTE: 53 mm/h — ABNORMAL HIGH (ref 0–20)

## 2024-05-04 MED ORDER — OXYCODONE 5 MG CAPSULE
5 | ORAL_CAPSULE | Freq: Three times a day (TID) | ORAL | 1 refills | 4.00000 days | Status: AC | PRN
Start: 2024-05-04 — End: ?

## 2024-05-04 NOTE — Telephone Encounter
 Nurse Triage NoteShirley Cross 's husband, [name], reports that Dawn Cross has been experiencing joint pain in her shoulders, feet, wrists and legs for the past week. Patient is also experiencing swelling in her hands. The pain is effecting her sleep.Patient has been taking Tylenol  with minimal effect.  Patient was seen at urgent care yesterday for the same symptoms and was told that it is arthritis pain.  Urgent care provider recommended following up with pain management provider, Dr. Gust. RN advised to contact Dr. Iran office but patient is requesting to be seen by Dr. Marry, today. Patient scheduled with Dr. Stelman for today at 4:30 pm. Callback instructions and ED precautions reviewed with patient. Care advice discussed, discussed signs and symptoms that would require a higher level of care. Patient verbalizes understanding.  DispositionPatient triaged using Schmitt-Thompson, per protocol patient advised to be seen today, appointment scheduledCare advice provided to the patient. Advised the patient to call back with any new or worsening symptoms. Patient verbalizes understanding.Escalation required: No Reason for Disposition SEVERE pain (e.g., excruciating, unable to do any normal activities) and not improved 2 hours after pain medicine   Patient was already seen at urgent care for this issue.Protocols used: Muscle Aches and Body Pain-Adult-[place]

## 2024-05-05 LAB — LYME ANTIBODIES W/RFLX TO CONFIRM (MODIFIED TWO-TIER TESTING)
BKR LYME TOTAL ANTIBODY INTERPRETATION: NEGATIVE
BKR LYME TOTAL ANTIBODY LI: 0.26 LI (ref <=0.90)

## 2024-05-07 ENCOUNTER — Inpatient Hospital Stay: Admit: 2024-05-07 | Discharge: 2024-05-07 | Payer: MEDICARE | Primary: Family Medicine

## 2024-05-07 ENCOUNTER — Encounter: Admit: 2024-05-07 | Payer: PRIVATE HEALTH INSURANCE | Primary: Family Medicine

## 2024-05-07 ENCOUNTER — Ambulatory Visit: Admit: 2024-05-07 | Payer: MEDICARE | Primary: Family Medicine

## 2024-05-07 ENCOUNTER — Encounter: Admit: 2024-05-07 | Payer: MEDICARE | Attending: Rheumatology | Primary: Family Medicine

## 2024-05-07 ENCOUNTER — Encounter: Admit: 2024-05-07 | Payer: PRIVATE HEALTH INSURANCE | Attending: Family Medicine | Primary: Family Medicine

## 2024-05-07 VITALS — BP 110/60 | Ht 65.0 in | Wt 145.0 lb

## 2024-05-07 DIAGNOSIS — M19042 Primary osteoarthritis, left hand: Secondary | ICD-10-CM

## 2024-05-07 DIAGNOSIS — M2550 Pain in unspecified joint: Principal | ICD-10-CM

## 2024-05-07 DIAGNOSIS — M19032 Primary osteoarthritis, left wrist: Secondary | ICD-10-CM

## 2024-05-07 DIAGNOSIS — M19041 Primary osteoarthritis, right hand: Secondary | ICD-10-CM

## 2024-05-07 DIAGNOSIS — M19011 Primary osteoarthritis, right shoulder: Secondary | ICD-10-CM

## 2024-05-07 DIAGNOSIS — M19012 Primary osteoarthritis, left shoulder: Secondary | ICD-10-CM

## 2024-05-07 DIAGNOSIS — M19031 Primary osteoarthritis, right wrist: Secondary | ICD-10-CM

## 2024-05-07 DIAGNOSIS — R7982 Elevated C-reactive protein (CRP): Secondary | ICD-10-CM

## 2024-05-07 DIAGNOSIS — M5416 Radiculopathy, lumbar region: Secondary | ICD-10-CM

## 2024-05-07 DIAGNOSIS — R700 Elevated erythrocyte sedimentation rate: Principal | ICD-10-CM

## 2024-05-07 LAB — SEDIMENTATION RATE (ESR): BKR SEDIMENTATION RATE, ERYTHROCYTE: 39 mm/h — ABNORMAL HIGH (ref 0–20)

## 2024-05-07 LAB — C-REACTIVE PROTEIN     (CRP): BKR C-REACTIVE PROTEIN, HIGH SENSITIVITY: 48.7 mg/L — ABNORMAL HIGH

## 2024-05-07 LAB — ANA IFA SCREEN W/REFL TO TITER AND PATTERN, IFA: BKR ANTI-NUCLEAR ANTIBODY (ANA): POSITIVE — AB

## 2024-05-07 MED ORDER — PREDNISONE 5 MG TABLET
5 | ORAL_TABLET | Freq: Every day | ORAL | 1 refills | 30.00000 days | Status: AC
Start: 2024-05-07 — End: ?

## 2024-05-08 ENCOUNTER — Encounter: Admit: 2024-05-08 | Payer: PRIVATE HEALTH INSURANCE | Primary: Family Medicine

## 2024-05-08 DIAGNOSIS — M2550 Pain in unspecified joint: Principal | ICD-10-CM

## 2024-05-08 LAB — ANA TITER AND PATTERN     (YH)
BKR ANA TITER 1: 1:160 {titer} — AB
BKR ANA TITER 2: 1:160 {titer} — AB

## 2024-05-09 ENCOUNTER — Encounter: Admit: 2024-05-09 | Payer: PRIVATE HEALTH INSURANCE | Primary: Family Medicine

## 2024-05-10 ENCOUNTER — Inpatient Hospital Stay: Admit: 2024-05-10 | Discharge: 2024-05-10 | Payer: MEDICARE | Primary: Family Medicine

## 2024-05-10 ENCOUNTER — Encounter: Admit: 2024-05-10 | Payer: PRIVATE HEALTH INSURANCE | Primary: Family Medicine

## 2024-05-10 ENCOUNTER — Telehealth: Admit: 2024-05-10 | Payer: PRIVATE HEALTH INSURANCE | Primary: Family Medicine

## 2024-05-10 DIAGNOSIS — M79643 Pain in unspecified hand: Principal | ICD-10-CM

## 2024-05-10 DIAGNOSIS — M7989 Other specified soft tissue disorders: Secondary | ICD-10-CM

## 2024-05-10 DIAGNOSIS — M19042 Primary osteoarthritis, left hand: Secondary | ICD-10-CM

## 2024-05-16 LAB — CBC AND DIFFERENTIAL
BASOPHILS ABSOLUTE COUNT: 16 {cells}/uL (ref 0–200)
BASOPHILS: 0.2 %
EOSINOPHILS ABSOLUTE COUNT: 8 {cells}/uL — ABNORMAL LOW (ref 15–500)
EOSINOPHILS: 0.1 %
HEMATOCRIT BLOOD: 37.2 % (ref 35.0–45.0)
HEMOGLOBIN: 11.8 g/dL (ref 11.7–15.5)
LYMPHOCYTES ABSOLUTE COUNT: 1150 {cells}/uL (ref 850–3900)
LYMPHOCYTES: 14.2 %
MCH: 29.1 pg (ref 27.0–33.0)
MCHC-HEMOGLOBINOPATHY: 31.7 g/dL — ABNORMAL LOW (ref 32.0–36.0)
MCV: 91.6 fL (ref 80.0–100.0)
MONOCYTES ABSOLUTE COUNT: 559 {cells}/uL (ref 200–950)
MONOCYTES: 6.9 %
MPV: 10.1 fL (ref 7.5–12.5)
NEUTROPHILS ABSOLUTE COUNT: 6367 {cells}/uL (ref 1500–7800)
NEUTROPHILS: 78.6 %
PLATELET COUNT: 292 Thousand/uL (ref 140–400)
RDW: 12.7 % (ref 11.0–15.0)
RED BLOOD CELL COUNT: 4.06 Million/uL (ref 3.80–5.10)
WHITE BLOOD CELL COUNT: 8.1 Thousand/uL (ref 3.8–10.8)

## 2024-05-16 LAB — BUN/CREATININE/EGFR     (BH GH LMW Q YH)
BLOOD UREA NITROGEN: 20 mg/dL (ref 7–25)
CREATININE: 0.7 mg/dL (ref 0.60–0.95)
EGFR, CREATININE (CKD-EPI 2021) QUEST: 87 mL/min/1.73m2 (ref >=60)

## 2024-05-16 LAB — ALBUMIN: ALBUMIN: 4 g/dL (ref 3.6–5.1)

## 2024-05-16 LAB — SEDIMENTATION RATE (ESR): SED RATE BY MODIFIED WESTERGREN: 6 mm/h (ref <=30)

## 2024-05-16 LAB — C-REACTIVE PROTEIN     (CRP): C-REACTIVE PROTEIN: <3 mg/L (ref <8.0)

## 2024-05-17 ENCOUNTER — Encounter: Admit: 2024-05-17 | Payer: PRIVATE HEALTH INSURANCE | Primary: Family Medicine

## 2024-05-17 ENCOUNTER — Encounter: Admit: 2024-05-17 | Payer: PRIVATE HEALTH INSURANCE | Attending: Family Medicine | Primary: Family Medicine

## 2024-05-17 ENCOUNTER — Telehealth: Admit: 2024-05-17 | Payer: PRIVATE HEALTH INSURANCE | Attending: Family Medicine | Primary: Family Medicine

## 2024-05-17 ENCOUNTER — Ambulatory Visit: Admit: 2024-05-17 | Payer: PRIVATE HEALTH INSURANCE | Attending: Family Medicine | Primary: Family Medicine

## 2024-05-17 ENCOUNTER — Inpatient Hospital Stay: Admit: 2024-05-17 | Discharge: 2024-05-17 | Payer: MEDICARE | Primary: Family Medicine

## 2024-05-17 VITALS — BP 120/70 | HR 72 | Temp 97.30000°F | Resp 16 | Ht 65.0 in | Wt 144.0 lb

## 2024-05-17 VITALS — BP 122/70 | Ht 65.0 in | Wt 144.0 lb

## 2024-05-17 DIAGNOSIS — I10 Essential (primary) hypertension: Principal | ICD-10-CM

## 2024-05-17 DIAGNOSIS — J449 Chronic obstructive pulmonary disease, unspecified: Secondary | ICD-10-CM

## 2024-05-17 DIAGNOSIS — J45909 Unspecified asthma, uncomplicated: Principal | ICD-10-CM

## 2024-05-17 DIAGNOSIS — J439 Emphysema, unspecified: Secondary | ICD-10-CM

## 2024-05-17 DIAGNOSIS — D126 Benign neoplasm of colon, unspecified: Secondary | ICD-10-CM

## 2024-05-17 DIAGNOSIS — E538 Deficiency of other specified B group vitamins: Secondary | ICD-10-CM

## 2024-05-17 DIAGNOSIS — R7303 Prediabetes: Secondary | ICD-10-CM

## 2024-05-17 DIAGNOSIS — M1990 Unspecified osteoarthritis, unspecified site: Secondary | ICD-10-CM

## 2024-05-17 DIAGNOSIS — F4322 Adjustment disorder with anxiety: Principal | ICD-10-CM

## 2024-05-17 DIAGNOSIS — M2550 Pain in unspecified joint: Secondary | ICD-10-CM

## 2024-05-17 DIAGNOSIS — I4891 Unspecified atrial fibrillation: Secondary | ICD-10-CM

## 2024-05-17 DIAGNOSIS — I011 Acute rheumatic endocarditis: Principal | ICD-10-CM

## 2024-05-17 DIAGNOSIS — G4733 Obstructive sleep apnea (adult) (pediatric): Secondary | ICD-10-CM

## 2024-05-17 DIAGNOSIS — M1711 Unilateral primary osteoarthritis, right knee: Secondary | ICD-10-CM

## 2024-05-17 DIAGNOSIS — E785 Hyperlipidemia, unspecified: Secondary | ICD-10-CM

## 2024-05-17 DIAGNOSIS — K5730 Diverticulosis of large intestine without perforation or abscess without bleeding: Secondary | ICD-10-CM

## 2024-05-17 DIAGNOSIS — Z23 Encounter for immunization: Secondary | ICD-10-CM

## 2024-05-17 DIAGNOSIS — Z0000 Encounter for general adult medical examination without abnormal findings: Secondary | ICD-10-CM

## 2024-05-17 LAB — COMPREHENSIVE METABOLIC PANEL
BKR A/G RATIO: 1.9 (ref 1.0–2.2)
BKR ALANINE AMINOTRANSFERASE (ALT): 24 U/L (ref 10–35)
BKR ALBUMIN: 4.3 g/dL (ref 3.6–5.1)
BKR ALKALINE PHOSPHATASE: 95 U/L (ref 9–122)
BKR ANION GAP: 12 (ref 7–17)
BKR ASPARTATE AMINOTRANSFERASE (AST): 19 U/L (ref 10–35)
BKR AST/ALT RATIO: 0.8
BKR BILIRUBIN TOTAL: 0.4 mg/dL (ref <=1.2)
BKR BLOOD UREA NITROGEN: 26 mg/dL — ABNORMAL HIGH (ref 8–23)
BKR BUN / CREAT RATIO: 35.6 — ABNORMAL HIGH (ref 8.0–23.0)
BKR CALCIUM: 9.7 mg/dL (ref 8.8–10.2)
BKR CHLORIDE: 98 mmol/L (ref 98–107)
BKR CO2: 28 mmol/L (ref 20–30)
BKR CREATININE DELTA: 0.03
BKR CREATININE: 0.73 mg/dL (ref 0.40–1.30)
BKR EGFR, CREATININE (CKD-EPI 2021): >60 mL/min/1.73m2 (ref >=60)
BKR GLOBULIN: 2.3 g/dL (ref 2.0–3.9)
BKR GLUCOSE: 95 mg/dL (ref 70–100)
BKR POTASSIUM: 4.6 mmol/L (ref 3.3–5.3)
BKR PROTEIN TOTAL: 6.6 g/dL (ref 5.9–8.3)
BKR SODIUM: 138 mmol/L (ref 136–144)

## 2024-05-17 LAB — CBC WITH AUTO DIFFERENTIAL
BKR WAM ABSOLUTE IMMATURE GRANULOCYTES.: 0.03 x 1000/ÂµL (ref 0.00–0.30)
BKR WAM ABSOLUTE LYMPHOCYTE COUNT.: 0.8 x 1000/ÂµL (ref 0.60–3.70)
BKR WAM ABSOLUTE NRBC: 0 x 1000/ÂµL (ref 0.00–1.00)
BKR WAM ANC (ABSOLUTE NEUTROPHIL COUNT): 7.62 x 1000/ÂµL — ABNORMAL HIGH (ref 2.00–7.60)
BKR WAM BASOPHIL ABSOLUTE COUNT.: 0.03 x 1000/ÂµL (ref 0.00–1.00)
BKR WAM BASOPHILS: 0.3 % (ref 0.0–1.4)
BKR WAM EOSINOPHIL ABSOLUTE COUNT.: 0 x 1000/ÂµL (ref 0.00–1.00)
BKR WAM EOSINOPHILS: 0 % (ref 0.0–5.0)
BKR WAM HEMATOCRIT: 39.5 % (ref 35.00–45.00)
BKR WAM HEMOGLOBIN: 12.3 g/dL (ref 11.7–15.5)
BKR WAM IMMATURE GRANULOCYTES: 0.3 % (ref 0.0–1.0)
BKR WAM LYMPHOCYTES: 8.9 % — ABNORMAL LOW (ref 17.0–50.0)
BKR WAM MCH: 28.3 pg (ref 27.0–33.0)
BKR WAM MCHC: 31.1 g/dL (ref 31.0–36.0)
BKR WAM MCV: 90.8 fL (ref 80.0–100.0)
BKR WAM MONOCYTE ABSOLUTE COUNT.: 0.5 x 1000/ÂµL (ref 0.00–1.00)
BKR WAM MONOCYTES: 5.6 % (ref 4.0–12.0)
BKR WAM MPV: 10.5 fL (ref 8.0–12.0)
BKR WAM NEUTROPHILS: 84.9 % — ABNORMAL HIGH (ref 39.0–72.0)
BKR WAM NUCLEATED RED BLOOD CELLS: 0 % (ref 0.0–1.0)
BKR WAM PLATELETS: 291 x1000/ÂµL (ref 150–420)
BKR WAM RDW-CV: 14.1 % (ref 11.0–15.0)
BKR WAM RED BLOOD CELL COUNT.: 4.35 M/ÂµL (ref 4.00–6.00)
BKR WAM WHITE BLOOD CELL COUNT: 9 x1000/ÂµL (ref 4.0–11.0)

## 2024-05-17 LAB — LIPID PANEL
BKR CHOLESTEROL/HDL RATIO: 2.1 (ref 0.0–5.0)
BKR CHOLESTEROL: 175 mg/dL
BKR HDL CHOLESTEROL: 83 mg/dL (ref >=40)
BKR LDL CHOLESTEROL SAMPSON CALCULATED: 80 mg/dL
BKR TRIGLYCERIDES: 60 mg/dL

## 2024-05-17 LAB — HEMOGLOBIN A1C
BKR ESTIMATED AVERAGE GLUCOSE: 134 mg/dL
BKR HEMOGLOBIN A1C: 6.3 % — ABNORMAL HIGH (ref 4.0–5.6)

## 2024-05-17 LAB — VITAMIN B12: BKR VITAMIN B12: 1779 pg/mL — ABNORMAL HIGH (ref 232–1245)

## 2024-05-17 MED ORDER — FOLIC ACID 1 MG TABLET
1 | ORAL_TABLET | Freq: Every day | ORAL | 4 refills | 30.00000 days | Status: AC
Start: 2024-05-17 — End: ?

## 2024-05-17 MED ORDER — ESCITALOPRAM 5 MG TABLET
5 | ORAL_TABLET | Freq: Every day | ORAL | 5 refills | 90.00000 days | Status: AC
Start: 2024-05-17 — End: ?

## 2024-05-17 MED ORDER — METHOTREXATE SODIUM 2.5 MG TABLET
2.5 | ORAL_TABLET | ORAL | 3 refills | 28.00000 days | Status: AC
Start: 2024-05-17 — End: ?

## 2024-05-17 MED ORDER — PREDNISONE 5 MG TABLET
5 | ORAL_TABLET | Freq: Every day | ORAL | 1 refills | 30.00000 days | Status: AC
Start: 2024-05-17 — End: ?

## 2024-05-24 ENCOUNTER — Encounter: Admit: 2024-05-24 | Payer: PRIVATE HEALTH INSURANCE | Primary: Family Medicine

## 2024-05-25 ENCOUNTER — Encounter: Admit: 2024-05-25 | Payer: PRIVATE HEALTH INSURANCE | Primary: Family Medicine

## 2024-05-25 DIAGNOSIS — M2550 Pain in unspecified joint: Principal | ICD-10-CM

## 2024-05-25 MED ORDER — PREDNISONE 5 MG TABLET
5 | ORAL_TABLET | Freq: Every day | ORAL | 1 refills | 15.00000 days | Status: AC
Start: 2024-05-25 — End: ?

## 2024-06-04 ENCOUNTER — Encounter: Admit: 2024-06-04 | Payer: PRIVATE HEALTH INSURANCE | Attending: Family Medicine | Primary: Family Medicine

## 2024-06-15 ENCOUNTER — Encounter
Admit: 2024-06-15 | Payer: PRIVATE HEALTH INSURANCE | Attending: Vascular and Interventional Radiology | Primary: Family Medicine

## 2024-06-18 LAB — CBC AND DIFFERENTIAL
BASOPHILS ABSOLUTE COUNT: 17 {cells}/uL (ref 0–200)
BASOPHILS: 0.2 %
EOSINOPHILS ABSOLUTE COUNT: 17 {cells}/uL (ref 15–500)
EOSINOPHILS: 0.2 %
HEMATOCRIT BLOOD: 38.4 % (ref 35.0–45.0)
HEMOGLOBIN: 12.2 g/dL (ref 11.7–15.5)
LYMPHOCYTES ABSOLUTE COUNT: 913 {cells}/uL (ref 850–3900)
LYMPHOCYTES: 11 %
MCH: 29.3 pg (ref 27.0–33.0)
MCHC-HEMOGLOBINOPATHY: 31.8 g/dL — ABNORMAL LOW (ref 32.0–36.0)
MCV: 92.1 fL (ref 80.0–100.0)
MONOCYTES ABSOLUTE COUNT: 515 {cells}/uL (ref 200–950)
MONOCYTES: 6.2 %
MPV: 10.3 fL (ref 7.5–12.5)
NEUTROPHILS ABSOLUTE COUNT: 6839 {cells}/uL (ref 1500–7800)
NEUTROPHILS: 82.4 %
PLATELET COUNT: 198 Thousand/uL (ref 140–400)
RDW: 14.5 % (ref 11.0–15.0)
RED BLOOD CELL COUNT: 4.17 Million/uL (ref 3.80–5.10)
WHITE BLOOD CELL COUNT: 8.3 Thousand/uL (ref 3.8–10.8)

## 2024-06-18 LAB — BUN/CREATININE/EGFR     (BH GH LMW Q YH)
BLOOD UREA NITROGEN: 19 mg/dL (ref 7–25)
CREATININE: 0.75 mg/dL (ref 0.60–0.95)
EGFR, CREATININE (CKD-EPI 2021) QUEST: 80 mL/min/1.73m2 (ref >=60)

## 2024-06-18 LAB — ALT: ALT (SGPT): 18 U/L (ref 6–29)

## 2024-06-18 LAB — AST: AST (SGOT): 19 U/L (ref 10–35)

## 2024-06-18 LAB — SEDIMENTATION RATE (ESR): SED RATE BY MODIFIED WESTERGREN: 2 mm/h (ref <=30)

## 2024-06-18 LAB — C-REACTIVE PROTEIN     (CRP): C-REACTIVE PROTEIN: <3 mg/L (ref <8.0)

## 2024-06-18 LAB — ALKALINE PHOSPHATASE: ALKALINE PHOSPHATASE: 72 U/L (ref 37–153)

## 2024-06-18 LAB — ALBUMIN: ALBUMIN: 4.2 g/dL (ref 3.6–5.1)

## 2024-07-06 ENCOUNTER — Encounter
Admit: 2024-07-06 | Payer: PRIVATE HEALTH INSURANCE | Attending: Vascular and Interventional Radiology | Primary: Family Medicine

## 2024-07-11 ENCOUNTER — Encounter: Admit: 2024-07-11 | Payer: PRIVATE HEALTH INSURANCE | Attending: Obstetrics and Gynecology | Primary: Family Medicine

## 2024-07-12 ENCOUNTER — Encounter: Admit: 2024-07-12 | Payer: PRIVATE HEALTH INSURANCE | Attending: Obstetrics and Gynecology | Primary: Family Medicine

## 2024-08-30 ENCOUNTER — Encounter: Admit: 2024-08-30 | Payer: PRIVATE HEALTH INSURANCE | Attending: Family Medicine | Primary: Family Medicine

## 2024-12-18 ENCOUNTER — Encounter: Admit: 2024-12-18 | Payer: PRIVATE HEALTH INSURANCE | Primary: Family Medicine

## 2024-12-20 ENCOUNTER — Encounter: Admit: 2024-12-20 | Payer: PRIVATE HEALTH INSURANCE | Attending: Pulmonary Disease | Primary: Family Medicine

## 2024-12-20 ENCOUNTER — Encounter: Admit: 2024-12-20 | Payer: PRIVATE HEALTH INSURANCE | Attending: Family Medicine | Primary: Family Medicine
